# Patient Record
Sex: Male | Born: 1990 | Race: White | Hispanic: No | Marital: Single | Smoking: Current every day smoker
Health system: Southern US, Community
[De-identification: ages and names within clinical notes are randomized; demographics above are authoritative.]

---
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
## 2019-01-23 ENCOUNTER — Emergency Department (HOSPITAL_COMMUNITY): Payer: Self-pay

## 2019-01-23 ENCOUNTER — Other Ambulatory Visit: Payer: Self-pay

## 2019-01-23 ENCOUNTER — Emergency Department (HOSPITAL_COMMUNITY)
Admission: EM | Admit: 2019-01-23 | Discharge: 2019-01-23 | Disposition: A | Discharge status: Home / Self Care | Payer: Self-pay | Attending: Emergency Medicine | Admitting: Emergency Medicine

## 2019-01-23 ENCOUNTER — Encounter (HOSPITAL_COMMUNITY): Payer: Self-pay | Admitting: Emergency Medicine

## 2019-01-23 DIAGNOSIS — N201 Calculus of ureter: Secondary | ICD-10-CM

## 2019-01-23 DIAGNOSIS — N132 Hydronephrosis with renal and ureteral calculous obstruction: Secondary | ICD-10-CM | POA: Insufficient documentation

## 2019-01-23 DIAGNOSIS — N1330 Unspecified hydronephrosis: Secondary | ICD-10-CM

## 2019-01-23 LAB — URINALYSIS, ROUTINE W REFLEX MICROSCOPIC
Bacteria, UA: NONE SEEN
Bilirubin Urine: NEGATIVE
Glucose, UA: NEGATIVE mg/dL
Ketones, ur: 20 mg/dL — AB
Leukocytes,Ua: NEGATIVE
Nitrite: NEGATIVE
Protein, ur: NEGATIVE mg/dL
Specific Gravity, Urine: 1.016 (ref 1.005–1.030)
pH: 6 (ref 5.0–8.0)

## 2019-01-23 MED ORDER — OXYCODONE-ACETAMINOPHEN 5-325 MG PO TABS: 1.0000 | ORAL_TABLET | Freq: Once | ORAL | Status: DC

## 2019-01-23 MED ORDER — TAMSULOSIN HCL 0.4 MG PO CAPS: ORAL_CAPSULE | ORAL | 0 refills | Status: AC

## 2019-01-23 MED ORDER — HYDROCODONE-ACETAMINOPHEN 5-325 MG PO TABS: 1.0000 | ORAL_TABLET | ORAL | 0 refills | Status: AC | PRN

## 2019-01-23 NOTE — ED Triage Notes (Signed)
PT c/o left sided flank pain since 4am this morning with increased urinary frequency x2 days.

## 2019-01-23 NOTE — ED Provider Notes (Signed)
[facility] EMERGENCY DEPARTMENT Provider Note   CSN: [id number] Arrival date & time: 01/23/19  [id number]    History   Chief Complaint Chief Complaint  Patient presents with  . Flank Pain    HPI Steven Petersen is a 28 y.o. male.     HPI   He presents for sensation of bladder urgency for 2 days, and left flank pain for 1 day.  He denies dysuria, urinary frequency, fever, chills, nausea or vomiting.  No similar problems in the past.  No family history of kidney stones.  There are no other known modifying factors.  History reviewed. No pertinent past medical history.  There are no active problems to display for this patient.   Past Surgical History:  Procedure Laterality Date  . WISDOM TOOTH EXTRACTION          Home Medications    Prior to Admission medications   Medication Sig Start Date End Date Taking? Authorizing Provider  HYDROcodone-acetaminophen (NORCO) 5-325 MG tablet Take 1-2 tablets by mouth every 4 (four) hours as needed for moderate pain. 01/23/19   Mancel BaleWentz, Bina Veenstra, MD  tamsulosin [facility](FLOMAX) 0.4 MG CAPS capsule 1 q HS to aid stone passage 01/23/19   Mancel BaleWentz, Tahja Liao, MD    Family History History reviewed. No pertinent family history.  Social History Social History   Tobacco Use  . Smoking status: Never Smoker  . Smokeless tobacco: Current User    Types: Chew  Substance Use Topics  . Alcohol use: Yes    Comment: once a week   . Drug use: Never     Allergies   Patient has no known allergies.   Review of Systems Review of Systems  All other systems reviewed and are negative.    Physical Exam Updated Vital Signs BP (!) 140/95 (BP Location: Left Arm)   Pulse 63   Temp 98 F (36.7 C) (Oral)   Resp 18   Ht 6' (1.829 m)   Wt 102.1 kg   SpO2 100%   BMI 30.52 kg/m   Physical Exam Vitals signs and nursing note reviewed.  Constitutional:      General: He is not in acute distress.    Appearance: He is well-developed. He is not ill-appearing,  toxic-appearing or diaphoretic.  HENT:     Head: Normocephalic and atraumatic.     Right Ear: External ear normal.     Left Ear: External ear normal.  Eyes:     Conjunctiva/sclera: Conjunctivae normal.     Pupils: Pupils are equal, round, and reactive to light.  Neck:     Musculoskeletal: Normal range of motion and neck supple.     Trachea: Phonation normal.  Cardiovascular:     Rate and Rhythm: Normal rate and regular rhythm.     Heart sounds: Normal heart sounds.  Pulmonary:     Effort: Pulmonary effort is normal.     Breath sounds: Normal breath sounds.  Abdominal:     Palpations: Abdomen is soft.     Tenderness: There is no abdominal tenderness.  Genitourinary:    Comments: No costovertebral angle tenderness with percussion. Musculoskeletal: Normal range of motion.  Skin:    General: Skin is warm and dry.  Neurological:     Mental Status: He is alert and oriented to person, place, and time.     Cranial Nerves: No cranial nerve deficit.     Sensory: No sensory deficit.     Motor: No abnormal muscle tone.  Coordination: Coordination normal.  Psychiatric:        Mood and Affect: Mood normal.        Behavior: Behavior normal.        Thought Content: Thought content normal.        Judgment: Judgment normal.      ED Treatments / Results  Labs (all labs ordered are listed, but only abnormal results are displayed) Labs Reviewed  URINALYSIS, ROUTINE W REFLEX MICROSCOPIC - Abnormal; Notable for the following components:      Result Value   Hgb urine dipstick LARGE (*)    Ketones, ur 20 (*)    All other components within normal limits    EKG None  Radiology Ct Renal Stone Study  Result Date: 01/23/2019 CLINICAL DATA:  Left flank pain with nausea since 2 a.m. EXAM: CT ABDOMEN AND PELVIS WITHOUT CONTRAST TECHNIQUE: Multidetector CT imaging of the abdomen and pelvis was performed following the standard protocol without IV contrast. COMPARISON:  None. FINDINGS: Lower  chest:  No contributory findings. Hepatobiliary: No focal liver abnormality.No evidence of biliary obstruction or stone. Pancreas: Unremarkable. Spleen: Unremarkable. Adrenals/Urinary Tract: Negative adrenals. 2 mm stone at the left UVJ with mild hydroureteronephrosis and mild distal periureteric edema. Unremarkable bladder. Stomach/Bowel:  No obstruction. No appendicitis. Vascular/Lymphatic: No acute vascular abnormality. No mass or adenopathy. Reproductive:No pathologic findings. Other: No ascites or pneumoperitoneum. Musculoskeletal: No acute abnormalities. IMPRESSION: Mild left hydroureteronephrosis from a 2 mm UVJ calculus. Electronically Signed   By: Marnee Spring M.D.   On: 01/23/2019 08:35    Procedures Procedures (including critical care time)  Medications Ordered in ED Medications  oxyCODONE-acetaminophen (PERCOCET/ROXICET) 5-325 MG per tablet 1 tablet (0 tablets Oral Hold 01/23/19 0745)     Initial Impression / Assessment and Plan / ED Course  I have reviewed the triage vital signs and the nursing notes.  Pertinent labs & imaging results that were available during my care of the patient were reviewed by me and considered in my medical decision making (see chart for details).  Clinical Course as of Jan 22 937  Tue Jan 23, 2019  0931 Normal except large blood and RBCs present  Urinalysis, Routine w reflex microscopic- may I&O cath if menses(!) [EW]  0931 Mild right hydronephrosis with 2 mm left UVJ stone.  Images reviewed by me   [EW]    Clinical Course User Index [EW] Mancel Bale, MD        Patient Vitals for the past 24 hrs:  BP Temp Temp src Pulse Resp SpO2 Height Weight  01/23/19 0734 (!) 140/95 - - - - - - -  01/23/19 0731 - - - - - - 6' (1.829 m) 102.1 kg  01/23/19 0730 - 98 F (36.7 C) Oral 63 18 100 % - -    9:30 AM Reevaluation with update and discussion. After initial assessment and treatment, an updated evaluation reveals he states he is pain-free at  this time.  He has recently voided.  Findings discussed and questions answered. Mancel Bale   Medical Decision Making: Evaluation consistent with small distal ureter stone with high likelihood of passage.  Doubt UTI, metabolic instability or impending vascular collapse.  CRITICAL CARE-no Performed by: Mancel Bale  Nursing Notes Reviewed/ Care Coordinated Applicable Imaging Reviewed Interpretation of Laboratory Data incorporated into ED treatment  The patient appears reasonably screened and/or stabilized for discharge and I doubt any other medical condition or other [facility] requiring further screening, evaluation, or treatment in the ED at  this time prior to discharge.  Plan: Home Medications-OTC as needed; Home Treatments-strain urine; return here if the recommended treatment, does not improve the symptoms; Recommended follow up-urology, 1 week and as needed   Final Clinical Impressions(s) / ED Diagnoses   Final diagnoses:  Left ureteral stone  Hydronephrosis of left kidney    ED Discharge Orders         Ordered    tamsulosin (FLOMAX) 0.4 MG CAPS capsule     01/23/19 0936    HYDROcodone-acetaminophen (NORCO) 5-325 MG tablet  Every 4 hours PRN     01/23/19 0936           Mancel Bale, MD 01/23/19 [phone]

## 2019-01-23 NOTE — Discharge Instructions (Signed)
You have a small kidney stone, 2 mm, in the lower ureter, almost into the bladder.  To help that movement, we are prescribing tamsulosin.  You will also be given a prescription for pain medicine.  Do not drive within 6 hours of taking the pain medicine.  Strain the urine with a urine strainer which we gave you, to help you find the stone when it passes.  It is a good idea to save the stone, and take you to the urologist when you follow-up with him.  They will probably be able to schedule you to see a urologist in [place] if you desire.

## 2019-11-03 ENCOUNTER — Other Ambulatory Visit: Payer: Self-pay

## 2019-11-03 DIAGNOSIS — Z20822 Contact with and (suspected) exposure to covid-19: Secondary | ICD-10-CM

## 2019-11-04 LAB — NOVEL CORONAVIRUS, NAA: SARS-CoV-2, NAA: NOT DETECTED

## 2019-11-08 ENCOUNTER — Other Ambulatory Visit: Payer: Self-pay

## 2019-11-08 ENCOUNTER — Encounter (HOSPITAL_COMMUNITY): Payer: Self-pay | Admitting: Emergency Medicine

## 2019-11-08 ENCOUNTER — Emergency Department (HOSPITAL_COMMUNITY)
Admission: EM | Admit: 2019-11-08 | Discharge: 2019-11-09 | Disposition: A | Discharge status: Home / Self Care | Payer: Self-pay | Attending: Emergency Medicine | Admitting: Emergency Medicine

## 2019-11-08 ENCOUNTER — Emergency Department (HOSPITAL_COMMUNITY): Payer: Self-pay

## 2019-11-08 DIAGNOSIS — F17290 Nicotine dependence, other tobacco product, uncomplicated: Secondary | ICD-10-CM | POA: Insufficient documentation

## 2019-11-08 DIAGNOSIS — R079 Chest pain, unspecified: Secondary | ICD-10-CM | POA: Insufficient documentation

## 2019-11-08 DIAGNOSIS — F17220 Nicotine dependence, chewing tobacco, uncomplicated: Secondary | ICD-10-CM | POA: Insufficient documentation

## 2019-11-08 DIAGNOSIS — Z9189 Other specified personal risk factors, not elsewhere classified: Secondary | ICD-10-CM | POA: Insufficient documentation

## 2019-11-08 LAB — BASIC METABOLIC PANEL
Anion gap: 10 (ref 5–15)
BUN: 13 mg/dL (ref 6–20)
CO2: 26 mmol/L (ref 22–32)
Calcium: 8.8 mg/dL — ABNORMAL LOW (ref 8.9–10.3)
Chloride: 103 mmol/L (ref 98–111)
Creatinine, Ser: 1.06 mg/dL (ref 0.61–1.24)
GFR calc Af Amer: >60 mL/min (ref >60)
GFR calc non Af Amer: >60 mL/min (ref >60)
Glucose, Bld: 91 mg/dL (ref 70–99)
Potassium: 3.9 mmol/L (ref 3.5–5.1)
Sodium: 139 mmol/L (ref 135–145)

## 2019-11-08 LAB — CBC
HCT: 46.2 % (ref 39.0–52.0)
Hemoglobin: 15.3 g/dL (ref 13.0–17.0)
MCH: 28.6 pg (ref 26.0–34.0)
MCHC: 33.1 g/dL (ref 30.0–36.0)
MCV: 86.4 fL (ref 80.0–100.0)
Platelets: 305 10*3/uL (ref 150–400)
RBC: 5.35 MIL/uL (ref 4.22–5.81)
RDW: 11.9 % (ref 11.5–15.5)
WBC: 6.5 10*3/uL (ref 4.0–10.5)
nRBC: 0 % (ref 0.0–0.2)

## 2019-11-08 LAB — TROPONIN I (HIGH SENSITIVITY): Troponin I (High Sensitivity): 2 ng/L (ref <18)

## 2019-11-08 MED ORDER — SODIUM CHLORIDE 0.9% FLUSH: 3.0000 mL | Freq: Once | INTRAVENOUS | Status: DC

## 2019-11-08 NOTE — ED Triage Notes (Signed)
Patient here with left sided chest pain and shortness of breath, radiating into his back.  He denies any nausea or vomiting at this time.  Patient states that it started a few days ago, increasing tonight where he needed to come in.

## 2019-11-09 LAB — TROPONIN I (HIGH SENSITIVITY): Troponin I (High Sensitivity): <2 ng/L (ref <18)

## 2019-11-09 NOTE — Discharge Instructions (Addendum)
Recommend becoming established with a primary care provider for further outpatient evaluation if symptoms continue.   Return to the ED with any new or worsening symptoms.

## 2019-11-09 NOTE — ED Provider Notes (Signed)
[facility] EMERGENCY DEPARTMENT Provider Note   CSN: [id number] Arrival date & time: 11/08/19  2131     History Chief Complaint  Patient presents with  . Chest Pain  . Shortness of Breath    Steven Petersen is a 29 y.o. male.  Patient to ED with complaint of discomfort that starts in his posterior upper back and radiates to the mid-chest. No SOB, cough, fever. He reports the episode lasted about 15 minutes before resolving. He has had similar episodes over a 1-2 week period. No identified alleviating or aggravating factors. He is a non-smoker, no other medical history.   The history is provided by the patient. No language interpreter was used.  Chest Pain Associated symptoms: back pain   Associated symptoms: no abdominal pain, no fever, no headache, no shortness of breath and no weakness   Shortness of Breath Associated symptoms: chest pain   Associated symptoms: no abdominal pain, no fever and no headaches        History reviewed. No pertinent past medical history.  There are no problems to display for this patient.   Past Surgical History:  Procedure Laterality Date  . WISDOM TOOTH EXTRACTION         No family history on file.  Social History   Tobacco Use  . Smoking status: Current Every Day Smoker    Types: E-cigarettes  . Smokeless tobacco: Current User    Types: Chew  Substance Use Topics  . Alcohol use: Yes    Comment: patient drinks 3-4 beers a day  . Drug use: Never    Home Medications Prior to Admission medications   Medication Sig Start Date End Date Taking? Authorizing Provider  HYDROcodone-acetaminophen (NORCO) 5-325 MG tablet Take 1-2 tablets by mouth every 4 (four) hours as needed for moderate pain. 01/23/19   Daleen Bo, MD  tamsulosin (FLOMAX) 0.4 MG CAPS capsule 1 q HS to aid stone passage 01/23/19   Daleen Bo, MD    Allergies    Patient has no known allergies.  Review of Systems   Review of Systems    Constitutional: Negative for chills and fever.  HENT: Negative.   Respiratory: Negative for shortness of breath.   Cardiovascular: Positive for chest pain.  Gastrointestinal: Negative.  Negative for abdominal pain.  Musculoskeletal: Positive for back pain.  Skin: Negative.   Neurological: Negative.  Negative for syncope, weakness and headaches.    Physical Exam Updated Vital Signs BP 128/89   Pulse (!) 54   Temp (!) 97.3 F (36.3 C) (Oral)   Resp 16   Ht 6' (1.829 m)   Wt 97.5 kg   SpO2 96%   BMI 29.16 kg/m   Physical Exam Vitals and nursing note reviewed.  Constitutional:      Appearance: He is well-developed.  HENT:     Head: Normocephalic.  Cardiovascular:     Rate and Rhythm: Normal rate and regular rhythm.     Heart sounds: No murmur.  Pulmonary:     Effort: Pulmonary effort is normal.     Breath sounds: Normal breath sounds. No wheezing, rhonchi or rales.  Chest:     Chest wall: No tenderness.  Abdominal:     General: Bowel sounds are normal.     Palpations: Abdomen is soft.     Tenderness: There is no abdominal tenderness. There is no guarding or rebound.  Musculoskeletal:        General: Normal range of motion.  Cervical back: Normal range of motion and neck supple.     Comments: Minimal tenderness of left parascapular area.   Skin:    General: Skin is warm and dry.     Findings: No rash.  Neurological:     Mental Status: He is alert and oriented to person, place, and time.     ED Results / Procedures / Treatments   Labs (all labs ordered are listed, but only abnormal results are displayed) Labs Reviewed  BASIC METABOLIC PANEL - Abnormal; Notable for the following components:      Result Value   Calcium 8.8 (*)    All other components within normal limits  CBC  TROPONIN I (HIGH SENSITIVITY)  TROPONIN I (HIGH SENSITIVITY)    EKG None  Radiology DG Chest 2 View  Result Date: 11/08/2019 CLINICAL DATA:  Shortness of breath EXAM: CHEST -  2 VIEW COMPARISON:  None. FINDINGS: The heart size and mediastinal contours are within normal limits. Both lungs are clear. The visualized skeletal structures are unremarkable. IMPRESSION: No active cardiopulmonary disease. Electronically Signed   By: Jasmine Pang M.D.   On: 11/08/2019 22:20    Procedures Procedures (including critical care time)  Medications Ordered in ED Medications  sodium chloride flush (NS) 0.9 % injection 3 mL (has no administration in time range)    ED Course  I have reviewed the triage vital signs and the nursing notes.  Pertinent labs & imaging results that were available during my care of the patient were reviewed by me and considered in my medical decision making (see chart for details).    MDM Rules/Calculators/A&P                      Patient to ED with brief episodes of chest/back discomfort over a 1-2 week period. Currently asymptomatic.   He is well appearing, 29 yo male. VSS. Labs unremarkable., CXR clear, EKG non-ischemic. Doubt ACS. Consider MSK vs anxiety. This was discussed with the patient.   Recommend PCP follow up.  Final Clinical Impression(s) / ED Diagnoses Final diagnoses:  None   1. Nonspecific chest discomfort.   Rx / DC Orders ED Discharge Orders    None       Elpidio Anis, Cordelia Poche 11/09/19 0018    Mesner, Barbara Cower, MD 11/09/19 [phone]

## 2020-06-05 IMAGING — CT CT RENAL STONE PROTOCOL
2 of 4 series · 17 of 46 positions shown, 19 images · non-contrast
Comparison: None.

CLINICAL DATA: Left flank pain with nausea since 2 a.m.

EXAM:
CT ABDOMEN AND PELVIS WITHOUT CONTRAST
TECHNIQUE: Multidetector CT imaging of the abdomen and pelvis was performed
following the standard protocol without IV contrast.

[Series 2: axial st · axial · 0.77mm/px · z∈[+928,+1388]mm · 14 of 104 slices shown, 16 images]
[im 6/104  soft-tissue]
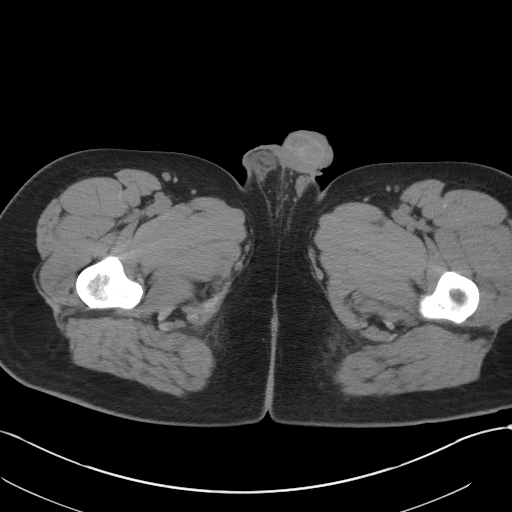
[im 6/104  bone]
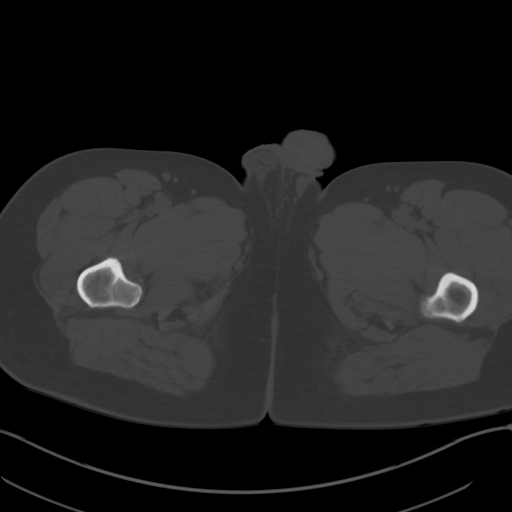
[im 12/104  soft-tissue]
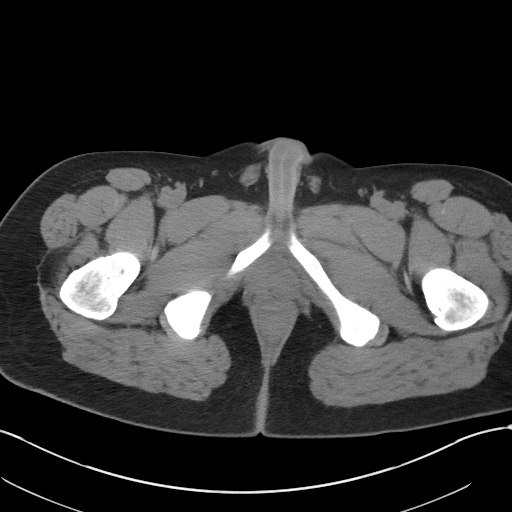
[im 23/104  soft-tissue]
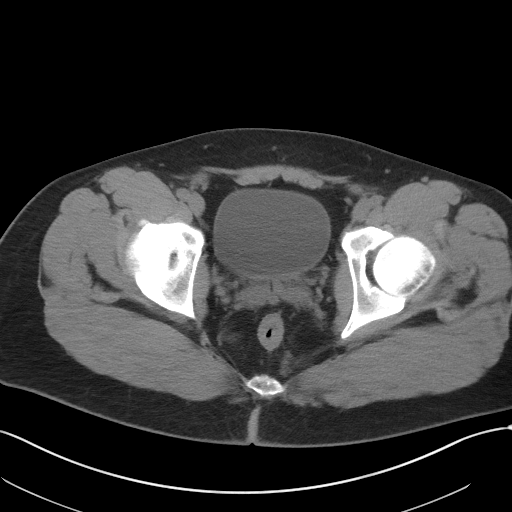
[im 29/104  soft-tissue]
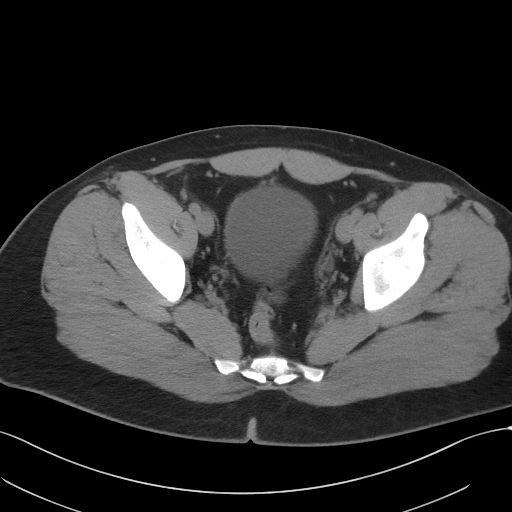
[im 35/104  soft-tissue]
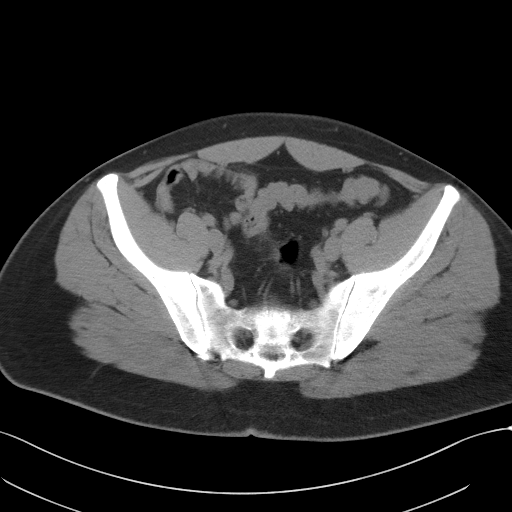
[im 41/104  soft-tissue]
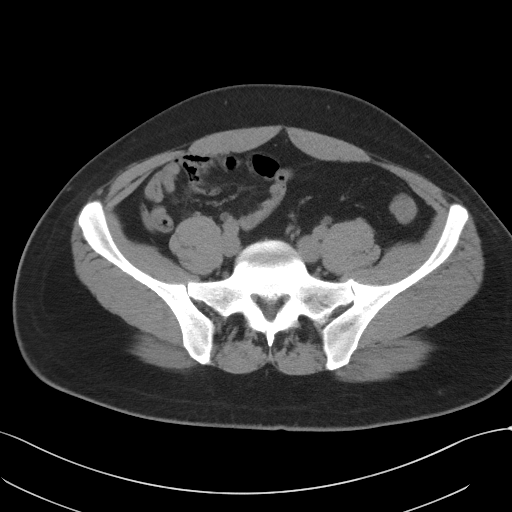
[im 46/104  soft-tissue]
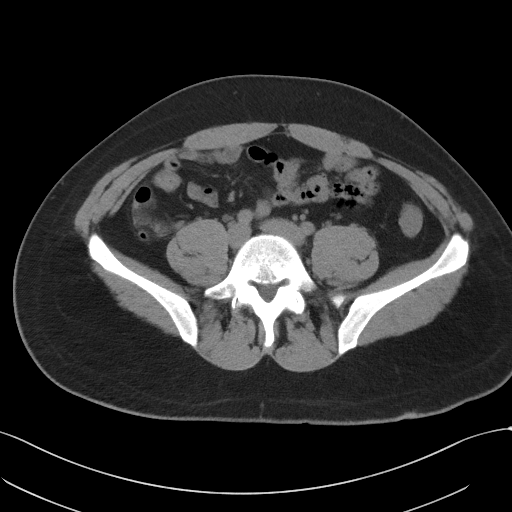
[im 58/104  soft-tissue]
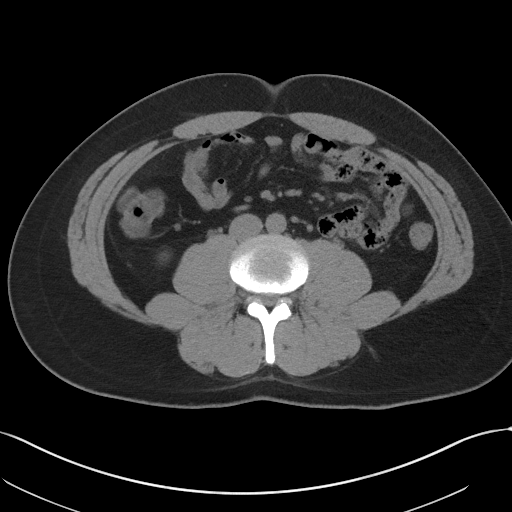
[im 63/104  soft-tissue]
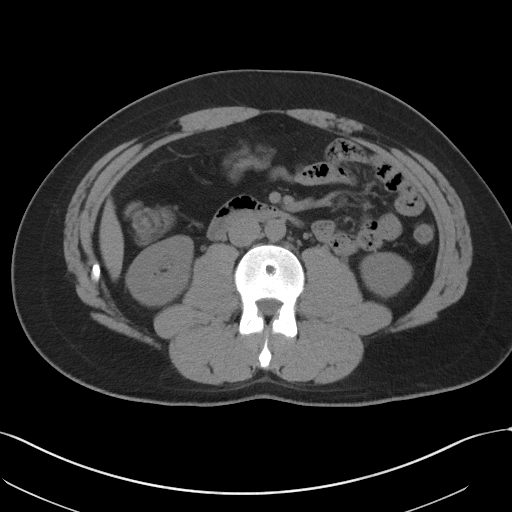
[im 63/104  bone]
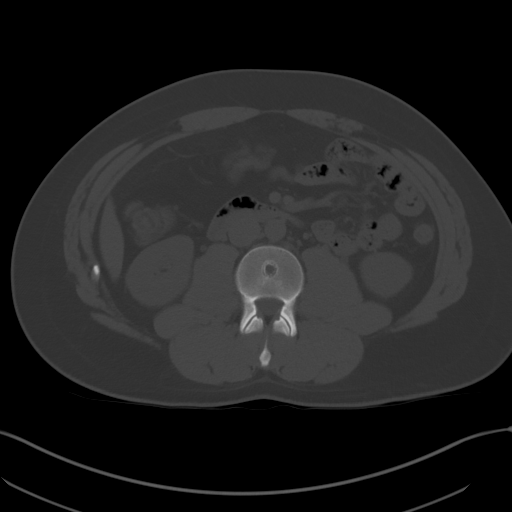
[im 69/104  soft-tissue]
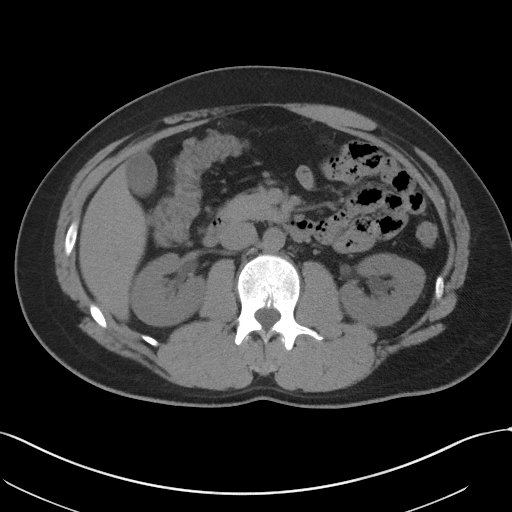
[im 75/104  soft-tissue]
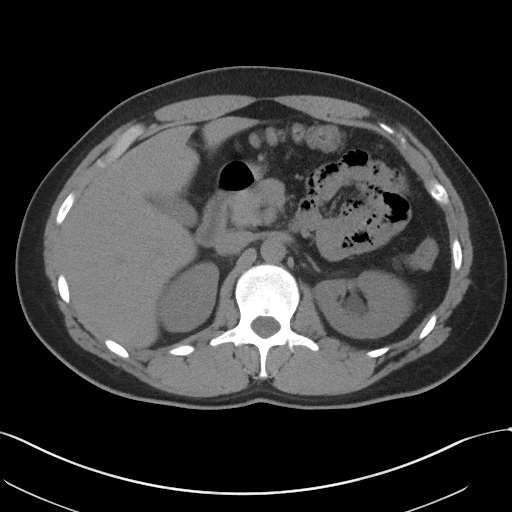
[im 81/104  soft-tissue]
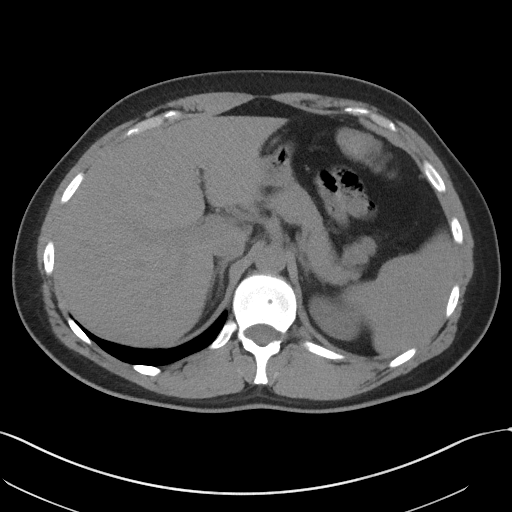
[im 92/104  soft-tissue]
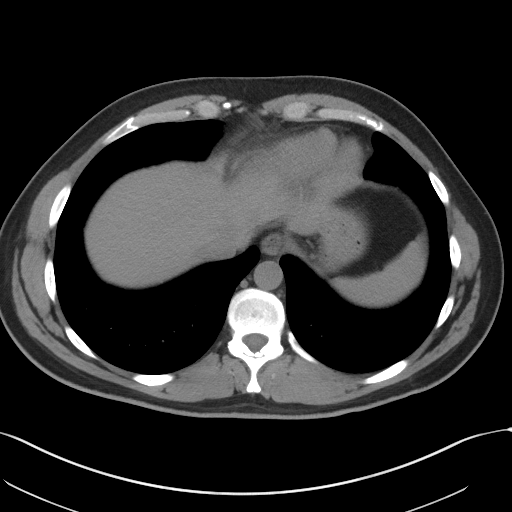
[im 98/104  soft-tissue]
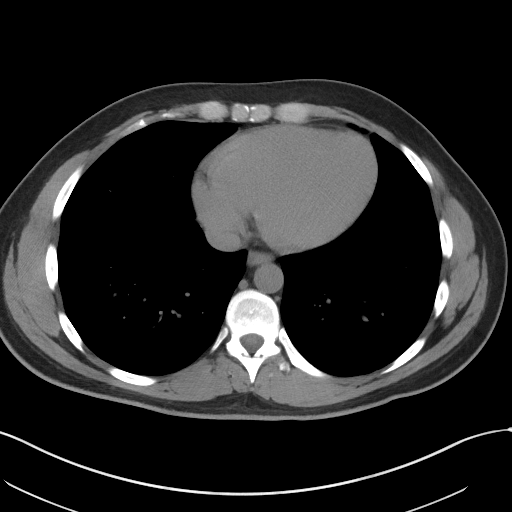

[Series 5: coronal st · coronal · 0.82mm/px · 3 of 93 slices shown]
[im 31/93  soft-tissue]
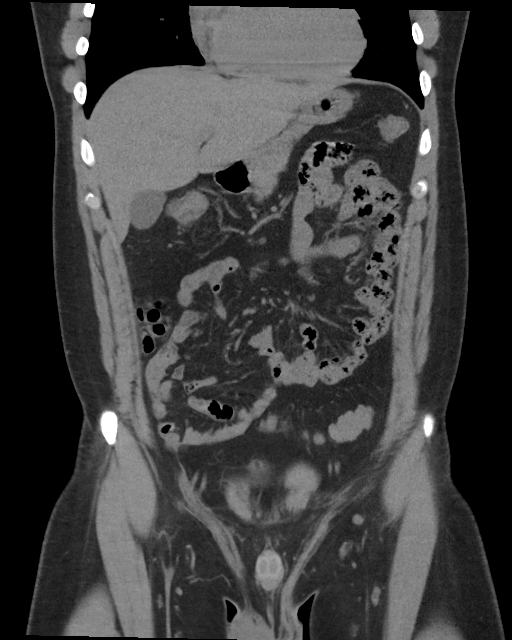
[im 41/93  soft-tissue]
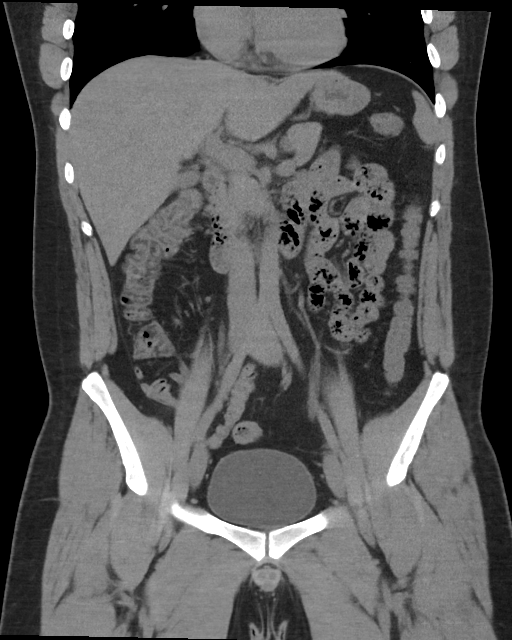
[im 52/93  soft-tissue]
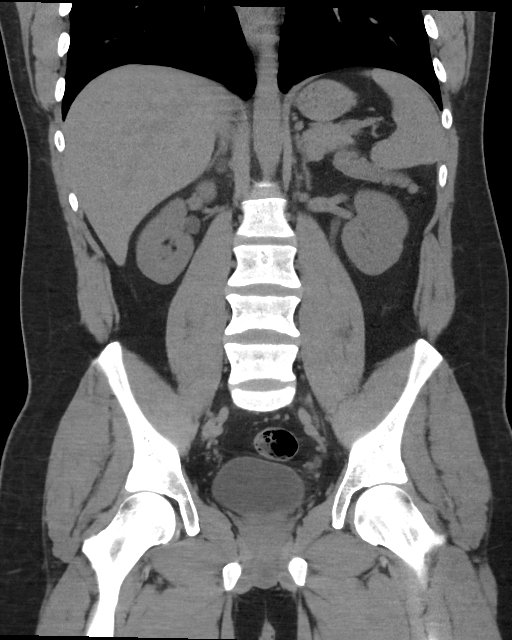

[17 of 46 positions shown; findings below may reference images not displayed]

FINDINGS: Lower chest:  No contributory findings.

Hepatobiliary: No focal liver abnormality.No evidence of biliary
obstruction or stone.

Pancreas: Unremarkable.

Spleen: Unremarkable.

Adrenals/Urinary Tract: Negative adrenals. 2 mm stone at the left
UVJ with mild hydroureteronephrosis and mild distal periureteric
edema. Unremarkable bladder.

Stomach/Bowel:  No obstruction. No appendicitis.

Vascular/Lymphatic: No acute vascular abnormality. No mass or
adenopathy.

Reproductive:No pathologic findings.

Other: No ascites or pneumoperitoneum.

Musculoskeletal: No acute abnormalities.
IMPRESSION: Mild left hydroureteronephrosis from a 2 mm UVJ calculus.

## 2021-03-21 IMAGING — CR DG CHEST 2V
2 series · 2 of 2 positions shown · non-contrast
Comparison: None.

CLINICAL DATA: Shortness of breath

EXAM:
CHEST - 2 VIEW

[chest pa]
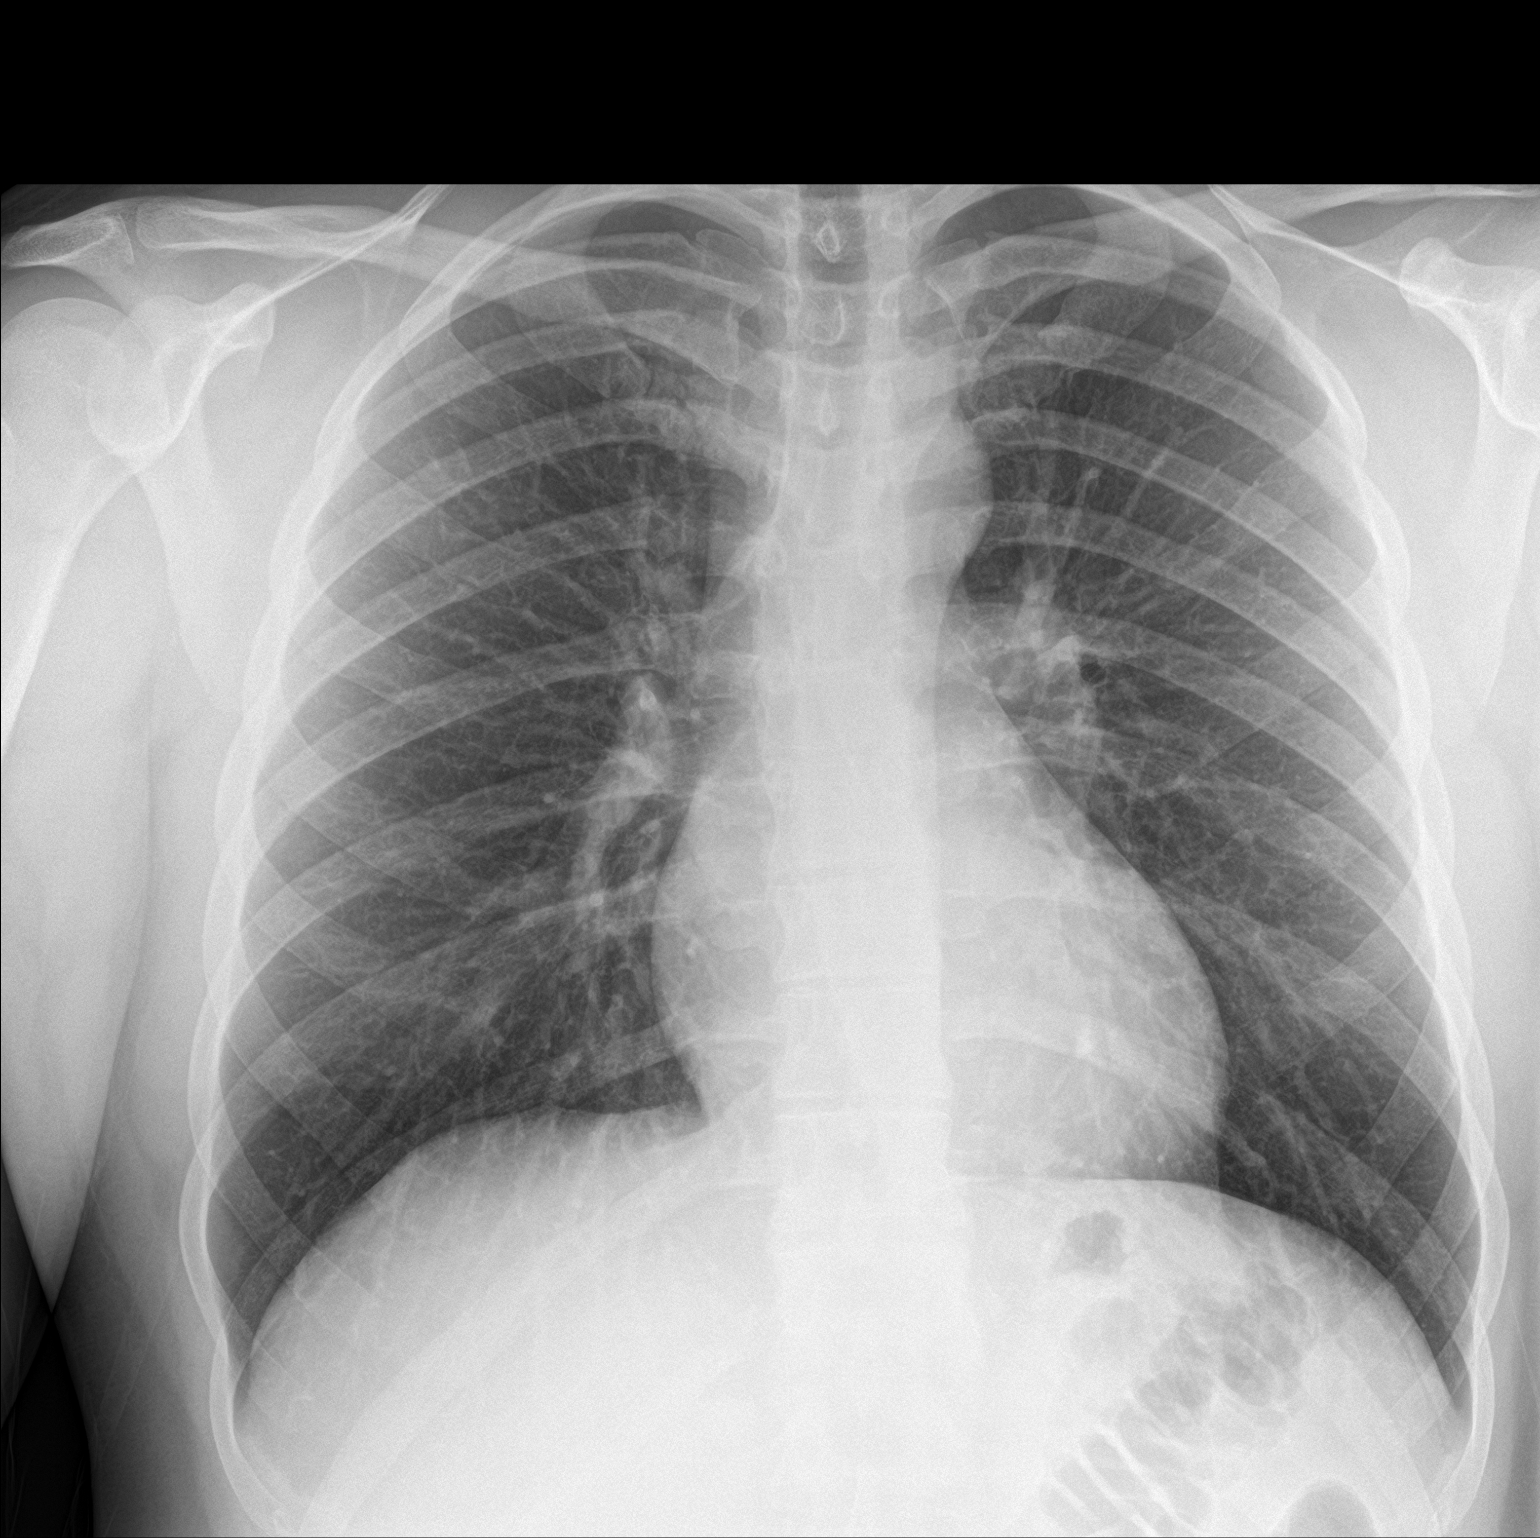

[chest lat]
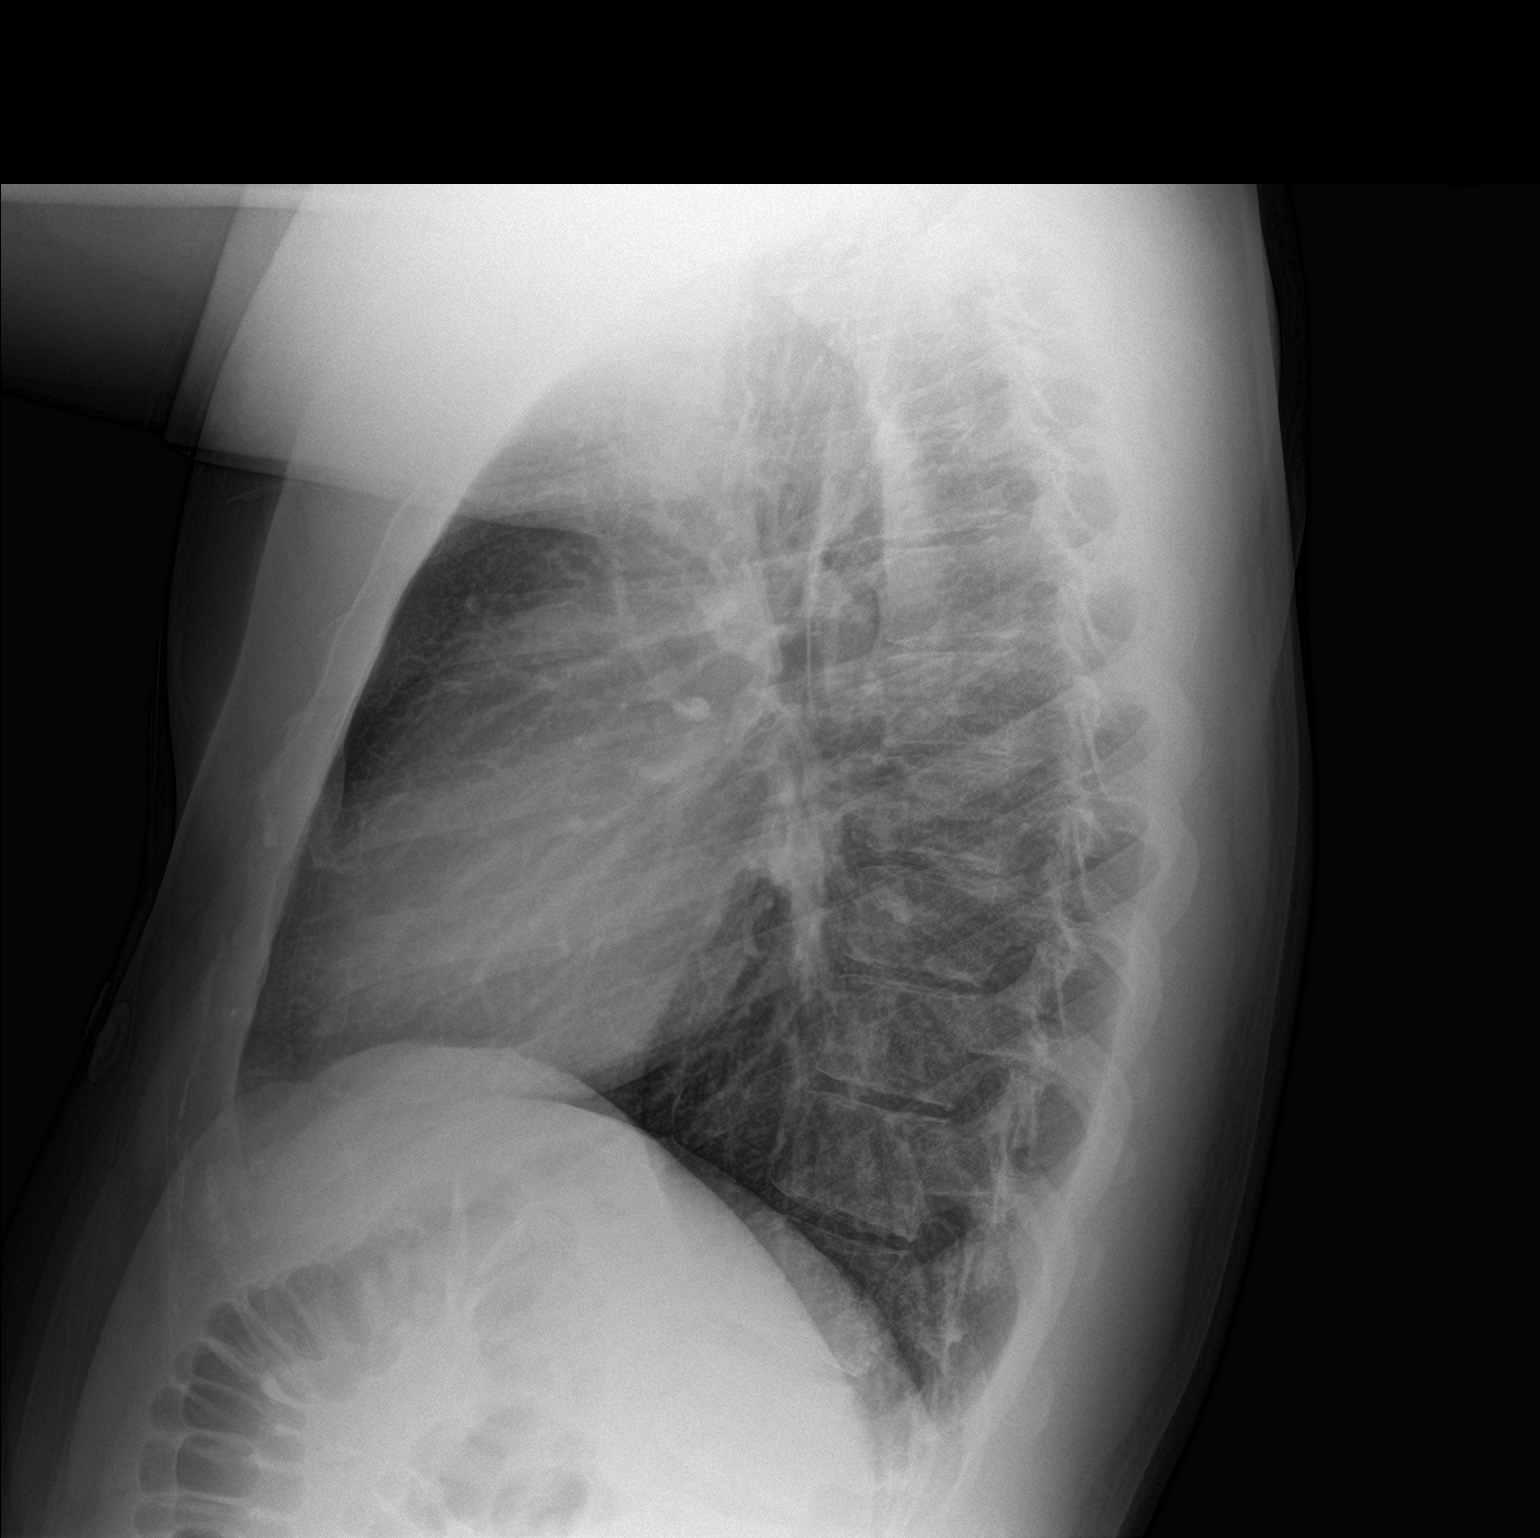

[2 of 2 positions shown; findings below may reference images not displayed]

FINDINGS: The heart size and mediastinal contours are within normal limits.
Both lungs are clear. The visualized skeletal structures are
unremarkable.
IMPRESSION: No active cardiopulmonary disease.
